# Patient Record
Sex: Female | Born: 1995 | Race: White | Hispanic: No | Marital: Single
Health system: Southern US, Community
[De-identification: ages and names within clinical notes are randomized; demographics above are authoritative.]

## PROBLEM LIST (undated history)

## (undated) DIAGNOSIS — J45909 Unspecified asthma, uncomplicated: Secondary | ICD-10-CM

---
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
## 2015-02-17 IMAGING — CR DG CHEST 2V
2 series · 2 of 2 positions shown · non-contrast
Comparison: None.

CLINICAL DATA: Cough for 1 week, getting worse

EXAM:
CHEST  2 VIEW

[w chest pa]
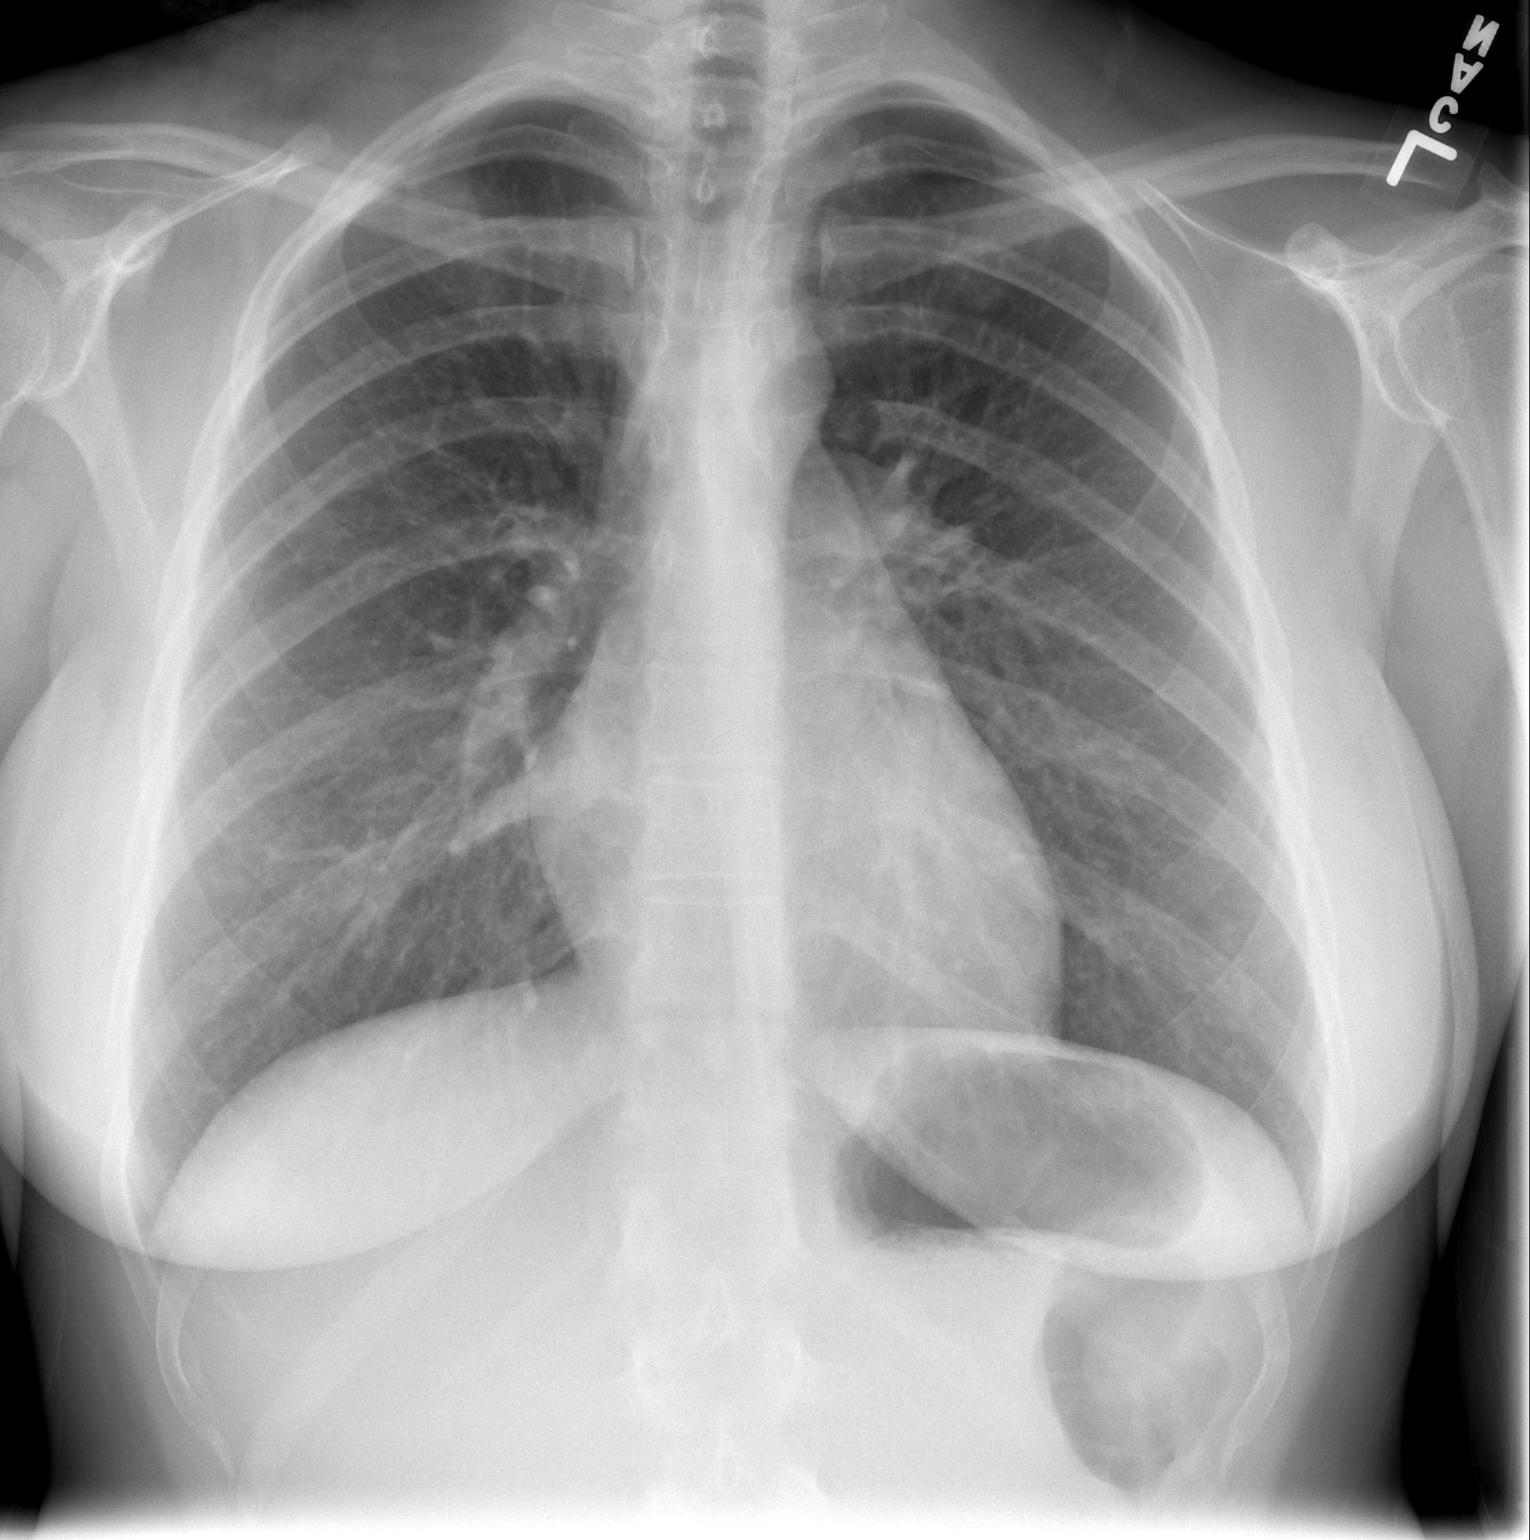

[w chest lat]
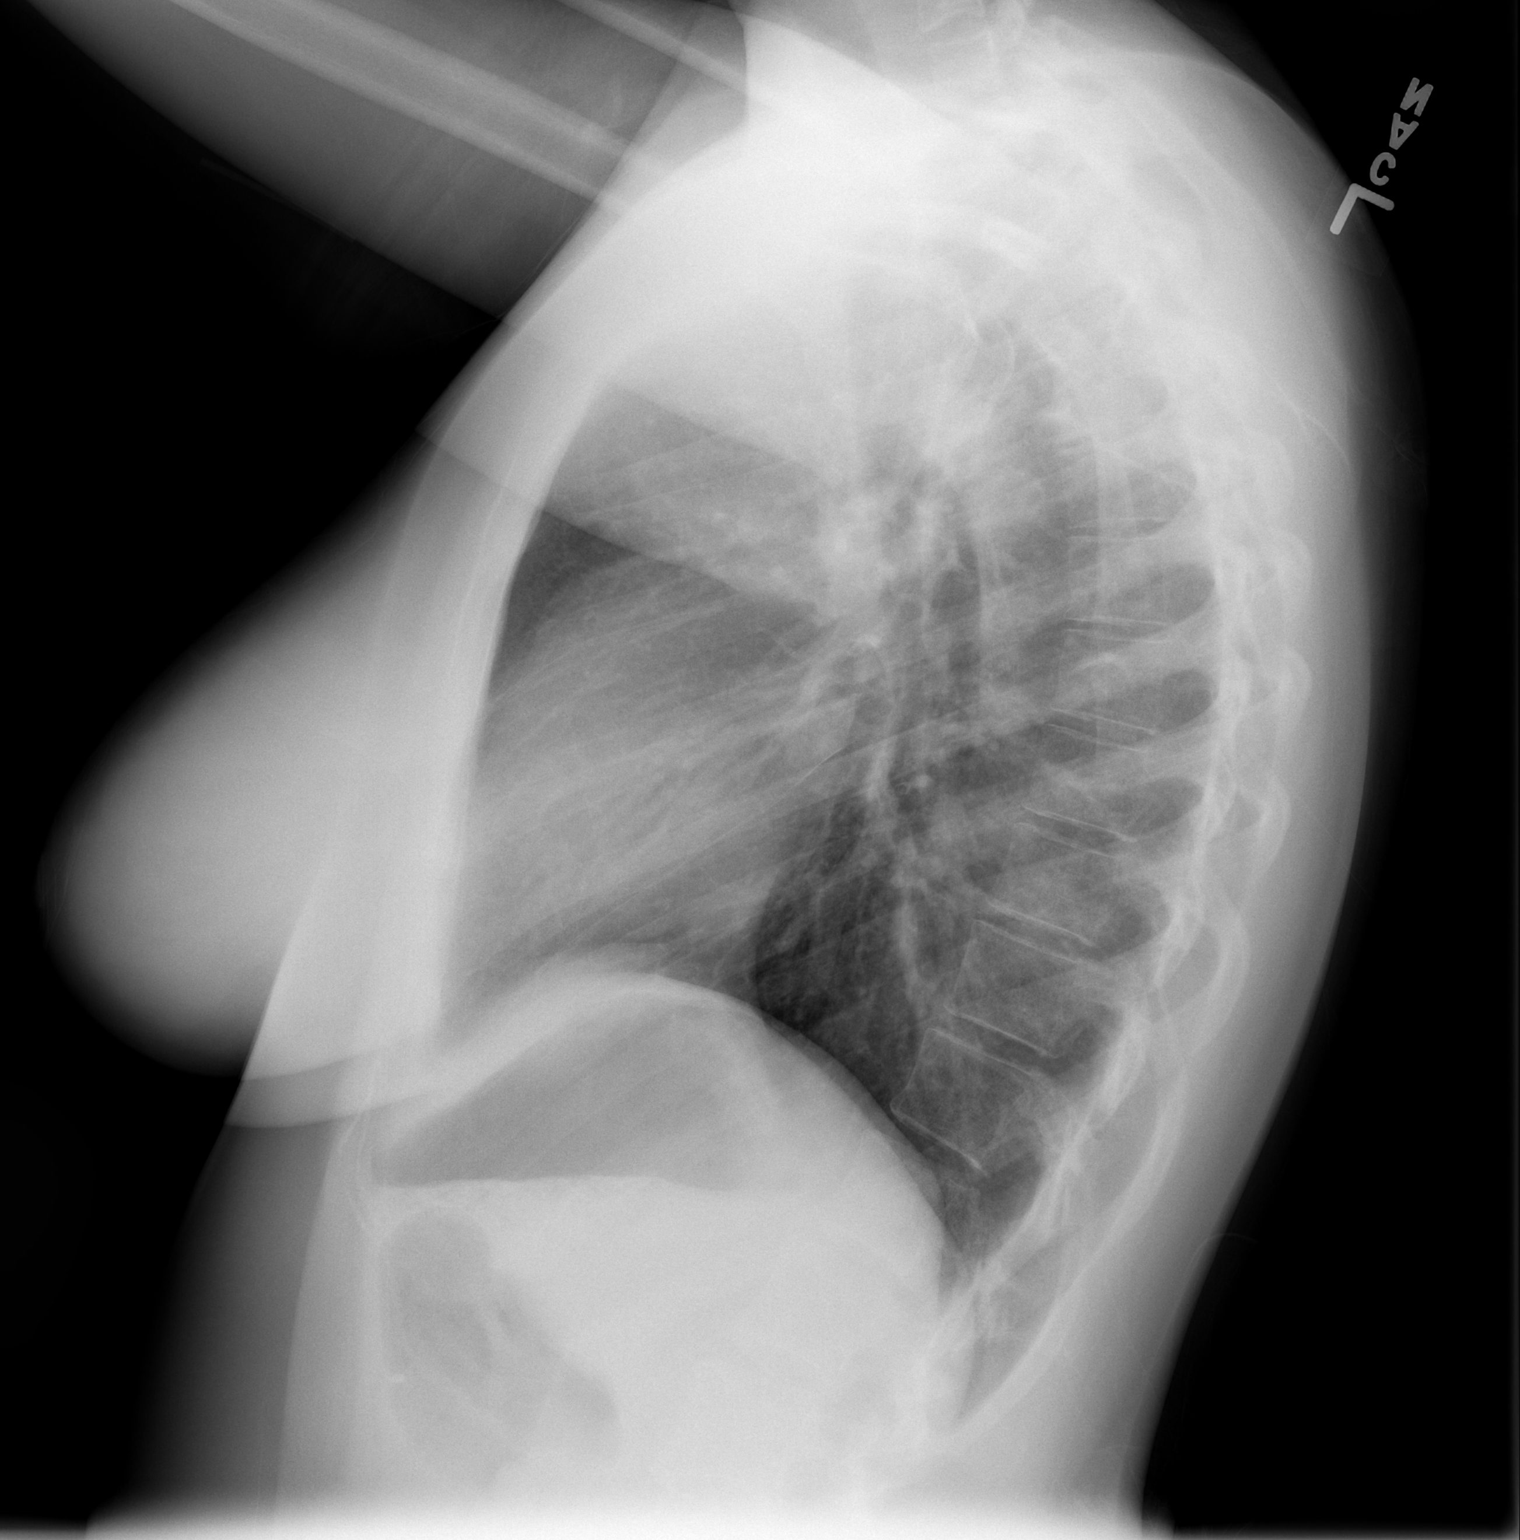

[2 of 2 positions shown; findings below may reference images not displayed]

FINDINGS: The heart size and mediastinal contours are within normal limits.
Both lungs are clear. The visualized skeletal structures are
unremarkable.
IMPRESSION: No active cardiopulmonary disease.

## 2015-11-24 ENCOUNTER — Emergency Department (HOSPITAL_BASED_OUTPATIENT_CLINIC_OR_DEPARTMENT_OTHER)
Admission: EM | Admit: 2015-11-24 | Discharge: 2015-11-24 | Disposition: A | Discharge status: Home / Self Care | Payer: Self-pay | Attending: Emergency Medicine | Admitting: Emergency Medicine

## 2015-11-24 ENCOUNTER — Encounter (HOSPITAL_BASED_OUTPATIENT_CLINIC_OR_DEPARTMENT_OTHER): Payer: Self-pay

## 2015-11-24 DIAGNOSIS — M545 Low back pain: Secondary | ICD-10-CM | POA: Insufficient documentation

## 2015-11-24 DIAGNOSIS — R197 Diarrhea, unspecified: Secondary | ICD-10-CM | POA: Insufficient documentation

## 2015-11-24 DIAGNOSIS — Z7722 Contact with and (suspected) exposure to environmental tobacco smoke (acute) (chronic): Secondary | ICD-10-CM | POA: Insufficient documentation

## 2015-11-24 DIAGNOSIS — R112 Nausea with vomiting, unspecified: Secondary | ICD-10-CM

## 2015-11-24 DIAGNOSIS — J45909 Unspecified asthma, uncomplicated: Secondary | ICD-10-CM | POA: Insufficient documentation

## 2015-11-24 DIAGNOSIS — M791 Myalgia: Secondary | ICD-10-CM | POA: Insufficient documentation

## 2015-11-24 DIAGNOSIS — R000 Tachycardia, unspecified: Secondary | ICD-10-CM | POA: Insufficient documentation

## 2015-11-24 LAB — CBC
HCT: 46 % (ref 36.0–46.0)
Hemoglobin: 15.8 g/dL — ABNORMAL HIGH (ref 12.0–15.0)
MCH: 29.1 pg (ref 26.0–34.0)
MCHC: 34.3 g/dL (ref 30.0–36.0)
MCV: 84.7 fL (ref 78.0–100.0)
PLATELETS: 259 10*3/uL (ref 150–400)
RBC: 5.43 MIL/uL — AB (ref 3.87–5.11)
RDW: 14 % (ref 11.5–15.5)
WBC: 9.7 10*3/uL (ref 4.0–10.5)

## 2015-11-24 LAB — COMPREHENSIVE METABOLIC PANEL
ALT: 17 U/L (ref 14–54)
AST: 21 U/L (ref 15–41)
Albumin: 4.5 g/dL (ref 3.5–5.0)
Alkaline Phosphatase: 55 U/L (ref 38–126)
Anion gap: 11 (ref 5–15)
BILIRUBIN TOTAL: 0.8 mg/dL (ref 0.3–1.2)
BUN: 16 mg/dL (ref 6–20)
CO2: 22 mmol/L (ref 22–32)
CREATININE: 0.65 mg/dL (ref 0.44–1.00)
Calcium: 9.3 mg/dL (ref 8.9–10.3)
Chloride: 104 mmol/L (ref 101–111)
GFR calc Af Amer: >60 mL/min (ref >60)
Glucose, Bld: 116 mg/dL — ABNORMAL HIGH (ref 65–99)
Potassium: 3.7 mmol/L (ref 3.5–5.1)
Sodium: 137 mmol/L (ref 135–145)
Total Protein: 7.9 g/dL (ref 6.5–8.1)

## 2015-11-24 LAB — PREGNANCY, URINE: PREG TEST UR: NEGATIVE

## 2015-11-24 LAB — URINALYSIS, ROUTINE W REFLEX MICROSCOPIC
GLUCOSE, UA: NEGATIVE mg/dL
Hgb urine dipstick: NEGATIVE
Leukocytes, UA: NEGATIVE
Nitrite: NEGATIVE
Protein, ur: NEGATIVE mg/dL
Specific Gravity, Urine: 1.036 — ABNORMAL HIGH (ref 1.005–1.030)
pH: 5.5 (ref 5.0–8.0)

## 2015-11-24 LAB — LIPASE, BLOOD

## 2015-11-24 MED ORDER — KETOROLAC TROMETHAMINE 30 MG/ML IJ SOLN
30.0000 mg | Freq: Once | INTRAMUSCULAR | Status: AC
  Administered 2015-11-24: 30 mg via INTRAVENOUS
  Filled 2015-11-24: qty 1

## 2015-11-24 MED ORDER — SODIUM CHLORIDE 0.9 % IV BOLUS (SEPSIS)
2000.0000 mL | Freq: Once | INTRAVENOUS | Status: AC
  Administered 2015-11-24: 2000 mL via INTRAVENOUS

## 2015-11-24 MED ORDER — ONDANSETRON HCL 4 MG/2ML IJ SOLN
4.0000 mg | Freq: Once | INTRAMUSCULAR | Status: AC
  Administered 2015-11-24: 4 mg via INTRAVENOUS

## 2015-11-24 MED ORDER — ONDANSETRON 4 MG PO TBDP: ORAL_TABLET | ORAL | Status: AC

## 2015-11-24 MED ORDER — ONDANSETRON HCL 4 MG/2ML IJ SOLN
4.0000 mg | Freq: Once | INTRAMUSCULAR | Status: DC | PRN
  Filled 2015-11-24: qty 2

## 2015-11-24 NOTE — ED Notes (Signed)
C/o abd pain w n/v x 1 day

## 2015-11-24 NOTE — ED Provider Notes (Signed)
CSN: [id number]     Arrival date & time 11/24/15  2035 History  By signing my name below, I, Domin[facility] Summit[facility]arrissa Davidson, attest that this documentation has been prepared under the direction and in the presence of Melene Planan Kadajah Kjos, DO. Electronically Signed: [name], ED Scribe. 11/24/2015. 11:32 PM.   Chief Complaint  Patient presents with  . Emesis   Patient is a 20 y.o. female presenting with vomiting. The history is provided by the patient. No language interpreter was used.  Emesis Severity:  Mild Duration:  1 day Timing:  Intermittent Quality:  Stomach contents How soon after eating does vomiting occur:  10 minutes Progression:  Unchanged Chronicity:  New Recent urination:  Normal Context: not post-tussive and not self-induced   Worsened by:  Liquids Associated symptoms: abdominal pain, diarrhea and myalgias (BLE)   Associated symptoms: no arthralgias, no chills, no fever and no headaches   Risk factors: sick contacts   HPI Comments: [name] is a 20 y.o. female who presents to the Emergency Department complaining of intermittent, mild emesis onset this morning. Patient reports nausea and constant, generalized abdominal pain yesterday and diarrhea x2 today. Mother notes body aches to the pt's back and legs. She has been eating and drinking less secondary to emesis and states that when she drinks she vomits it back up. She notes recent sick contact with friends who had similar symptoms. Denies fever or any other symptoms.  Past Medical History  Diagnosis Date  . Asthma     exercise induced   History reviewed. No pertinent past surgical history. No family history on file. Social History  Substance Use Topics  . Smoking status: Passive Smoke Exposure - Never Smoker  . Smokeless tobacco: None  . Alcohol Use: None   OB History    No data available     Review of Systems  Constitutional: Negative for fever and chills.  HENT: Negative for congestion and rhinorrhea.   Eyes:  Negative for redness and visual disturbance.  Respiratory: Negative for shortness of breath and wheezing.   Cardiovascular: Negative for chest pain and palpitations.  Gastrointestinal: Positive for nausea, vomiting, abdominal pain and diarrhea.  Genitourinary: Negative for dysuria and urgency.  Musculoskeletal: Positive for myalgias (BLE) and back pain. Negative for arthralgias.  Skin: Negative for pallor and wound.  Neurological: Negative for dizziness and headaches.      Allergies  Review of patient's allergies indicates no known allergies.  Home Medications   Prior to Admission medications   Medication Sig Start Date End Date Taking? Authorizing Provider  albuterol (PROVENTIL HFA;VENTOLIN HFA) 108 (90 BASE) MCG/ACT inhaler Inhale 1-2 puffs into the lungs every 6 (six) hours as needed for wheezing or shortness of breath. 11/21/13   Elpidio AnisShari Upstill, PA-C  azithromycin (ZITHROMAX Z-PAK) 250 MG tablet Take 1 tablet (250 mg total) by mouth daily. 11/21/13   Elpidio AnisShari Upstill, PA-C  chlorpheniramine-HYDROcodone (TUSSIONEX PENNKINETIC ER) 10-8 MG/5ML LQCR Take 5 mLs by mouth at bedtime as needed for cough. 11/21/13   Elpidio AnisShari Upstill, PA-C  ondansetron (ZOFRAN ODT) 4 MG disintegrating tablet 4mg  ODT q4 hours prn nausea/vomit 11/24/15   Melene Planan Lizbeth Feijoo, DO   BP 106/71 mmHg  Pulse 112  Temp(Src) 98.1 F (36.7 C) (Oral)  Resp 98  Ht 5' (1.524 m)  Wt 180 lb (81.647 kg)  BMI 35.15 kg/m2  SpO2 98%  LMP 11/04/2015 (Exact Date) Physical Exam  Constitutional: She is oriented to person, place, and time. She appears well-developed and well-nourished. No distress.  HENT:  Head: Normocephalic and atraumatic.  Eyes: EOM are normal. Pupils are equal, round, and reactive to light.  Neck: Normal range of motion. Neck supple.  Cardiovascular: Regular rhythm.  Tachycardia present.  Exam reveals no gallop and no friction rub.   No murmur heard. Tachycardic but regular.  Pulmonary/Chest: Effort normal. She has no  wheezes. She has no rales.  Lungs CTA.  Abdominal: Soft. She exhibits no distension. There is tenderness. There is negative Murphy's sign.  No focal areas of abdominal tenderness. Negative Murphy's sign.  Musculoskeletal: She exhibits no edema or tenderness.  Neurological: She is alert and oriented to person, place, and time.  Skin: Skin is warm and dry. She is not diaphoretic.  Psychiatric: She has a normal mood and affect. Her behavior is normal.  Nursing note and vitals reviewed.   ED Course  Procedures   DIAGNOSTIC STUDIES: Oxygen Saturation is 97% on RA, normal by my interpretation.    COORDINATION OF CARE: 9:04 PM Will order IV fluids, Zofran, labs. Discussed ordering abdominal CT and pt and I agree to wait until reevaluation before ordering this advanced imaging. Discussed treatment plan with pt at bedside and pt agreed to plan.   Labs Review Labs Reviewed  LIPASE, BLOOD - Abnormal; Notable for the following:    Lipase <10 (*)    All other components within normal limits  COMPREHENSIVE METABOLIC PANEL - Abnormal; Notable for the following:    Glucose, Bld 116 (*)    All other components within normal limits  CBC - Abnormal; Notable for the following:    RBC 5.43 (*)    Hemoglobin 15.8 (*)    All other components within normal limits  URINALYSIS, ROUTINE W REFLEX MICROSCOPIC (NOT AT [facility]) - Abnormal; Notable for the following:    Color, Urine AMBER (*)    APPearance CLOUDY (*)    Specific Gravity, Urine 1.036 (*)    Bilirubin Urine SMALL (*)    Ketones, ur >80 (*)    All other components within normal limits  PREGNANCY, URINE    Imaging Review No results found. I have personally reviewed and evaluated these images and lab results as part of my medical decision-making.   MDM   Final diagnoses:  Nausea vomiting and diarrhea    20 yo F With a chief complaint of vomiting. This is been continuously since yesterday. Unable to tolerate anything by mouth. Patient  is feeling generally unwell with cramping all over her body. Labs unremarkable. Patient was given 2 L of fluid with significant improvement of her symptoms tachycardic to 130s on arrival resolved with fluids.Mild abdominal pain, improved with toradol, no focal findings on abdominal exam.  Discharge home.  I personally performed the services described in this documentation, which was scribed in my presence. The recorded information has been reviewed and is accurate.   11:32 PM:  I have discussed the diagnosis/risks/treatment options with the patient and family and believe the pt to be eligible for discharge home to follow-up with PCP. We also discussed returning to the ED immediately if new or worsening sx occur. We discussed the sx which are most concerning (e.g., sudden worsening pain, fever, inability to tolerate by mouth) that necessitate immediate return. Medications administered to the patient during their visit and any new prescriptions provided to the patient are listed below.  Medications given during this visit Medications  ondansetron (ZOFRAN) injection 4 mg (not administered)  ondansetron (ZOFRAN) injection 4 mg (4 mg Intravenous Given 11/24/15 2115)  sodium chloride 0.9 % bolus 2,000 mL (0 mLs Intravenous Stopped 11/24/15 2303)  ketorolac (TORADOL) 30 MG/ML injection 30 mg (30 mg Intravenous Given 11/24/15 2156)    New Prescriptions   ONDANSETRON (ZOFRAN ODT) 4 MG DISINTEGRATING TABLET     ODT q4 hours prn nausea/vomit    The patient appears reasonably screen and/or stabilized for discharge and I doubt any other medical condition or other EMC requiring further screening, evaluation, or treatment in the ED at this time prior to discharge.     Melene Plan, DO 11/24/15 2332

## 2015-11-24 NOTE — Discharge Instructions (Signed)

## 2015-11-24 NOTE — ED Notes (Signed)
Pt c/o n/v/d since this morning and diffuse abdominal pain, recent sick contact with friend's child that had same symptoms.
